# Patient Record
Sex: Female | Born: 1935 | Race: Black or African American | Hispanic: No | Smoking: Current every day smoker
Health system: Southern US, Community
[De-identification: ages and names within clinical notes are randomized; demographics above are authoritative.]

## PROBLEM LIST (undated history)

## (undated) DIAGNOSIS — E559 Vitamin D deficiency, unspecified: Secondary | ICD-10-CM

## (undated) DIAGNOSIS — I10 Essential (primary) hypertension: Secondary | ICD-10-CM

## (undated) DIAGNOSIS — K219 Gastro-esophageal reflux disease without esophagitis: Secondary | ICD-10-CM

## (undated) DIAGNOSIS — M109 Gout, unspecified: Secondary | ICD-10-CM

## (undated) DIAGNOSIS — Z972 Presence of dental prosthetic device (complete) (partial): Secondary | ICD-10-CM

## (undated) DIAGNOSIS — M1990 Unspecified osteoarthritis, unspecified site: Secondary | ICD-10-CM

## (undated) DIAGNOSIS — I509 Heart failure, unspecified: Secondary | ICD-10-CM

## (undated) DIAGNOSIS — R609 Edema, unspecified: Secondary | ICD-10-CM

## (undated) DIAGNOSIS — N189 Chronic kidney disease, unspecified: Secondary | ICD-10-CM

## (undated) DIAGNOSIS — E876 Hypokalemia: Secondary | ICD-10-CM

## (undated) HISTORY — PX: TOTAL HIP ARTHROPLASTY: SHX124

## (undated) HISTORY — PX: ABDOMINAL HYSTERECTOMY: SHX81

---

## 2005-02-27 ENCOUNTER — Emergency Department: Payer: Self-pay | Admitting: Unknown Physician Specialty

## 2005-03-01 ENCOUNTER — Ambulatory Visit: Payer: Self-pay | Admitting: Family Medicine

## 2005-05-09 ENCOUNTER — Emergency Department: Payer: Self-pay | Admitting: Emergency Medicine

## 2005-09-20 ENCOUNTER — Other Ambulatory Visit: Payer: Self-pay

## 2005-09-20 ENCOUNTER — Inpatient Hospital Stay: Payer: Self-pay | Admitting: Orthopedic Surgery

## 2007-01-04 ENCOUNTER — Emergency Department: Payer: Self-pay | Admitting: Emergency Medicine

## 2012-05-13 ENCOUNTER — Emergency Department: Payer: Self-pay | Admitting: Emergency Medicine

## 2012-05-13 LAB — COMPREHENSIVE METABOLIC PANEL
Albumin: 2.4 g/dL — ABNORMAL LOW (ref 3.4–5.0)
Alkaline Phosphatase: 164 U/L — ABNORMAL HIGH (ref 50–136)
Calcium, Total: 9.1 mg/dL (ref 8.5–10.1)
Chloride: 104 mmol/L (ref 98–107)
EGFR (African American): >60
Osmolality: 282 (ref 275–301)
Potassium: 3.4 mmol/L — ABNORMAL LOW (ref 3.5–5.1)
Total Protein: 7.7 g/dL (ref 6.4–8.2)

## 2012-05-13 LAB — URINALYSIS, COMPLETE
Bacteria: NONE SEEN
Bilirubin,UR: NEGATIVE
Leukocyte Esterase: NEGATIVE
Protein: 30
RBC,UR: 2 /HPF (ref 0–5)
Specific Gravity: 1.02 (ref 1.003–1.030)
Squamous Epithelial: <1
WBC UR: 1 /HPF (ref 0–5)

## 2012-05-13 LAB — CBC
MCH: 26.1 pg (ref 26.0–34.0)
Platelet: 333 10*3/uL (ref 150–440)
RBC: 4.49 10*6/uL (ref 3.80–5.20)

## 2012-05-13 LAB — TROPONIN I: Troponin-I: <0.02 ng/mL

## 2015-08-31 ENCOUNTER — Encounter: Payer: Self-pay | Admitting: *Deleted

## 2015-09-07 NOTE — Discharge Instructions (Signed)

## 2015-09-09 ENCOUNTER — Encounter
Admission: RE | Disposition: A | Discharge status: Home / Self Care | Payer: Self-pay | Source: Ambulatory Visit | Attending: Ophthalmology

## 2015-09-09 ENCOUNTER — Ambulatory Visit: Payer: Medicare (Managed Care) | Admitting: Anesthesiology

## 2015-09-09 ENCOUNTER — Ambulatory Visit
Admission: RE | Admit: 2015-09-09 | Discharge: 2015-09-09 | Disposition: A | Discharge status: Home / Self Care | Payer: Medicare (Managed Care) | Source: Ambulatory Visit | Attending: Ophthalmology | Admitting: Ophthalmology

## 2015-09-09 DIAGNOSIS — M1990 Unspecified osteoarthritis, unspecified site: Secondary | ICD-10-CM | POA: Insufficient documentation

## 2015-09-09 DIAGNOSIS — H2589 Other age-related cataract: Secondary | ICD-10-CM | POA: Insufficient documentation

## 2015-09-09 DIAGNOSIS — I110 Hypertensive heart disease with heart failure: Secondary | ICD-10-CM | POA: Insufficient documentation

## 2015-09-09 DIAGNOSIS — I509 Heart failure, unspecified: Secondary | ICD-10-CM | POA: Insufficient documentation

## 2015-09-09 DIAGNOSIS — F17210 Nicotine dependence, cigarettes, uncomplicated: Secondary | ICD-10-CM | POA: Insufficient documentation

## 2015-09-09 HISTORY — DX: Hypokalemia: E87.6

## 2015-09-09 HISTORY — DX: Presence of dental prosthetic device (complete) (partial): Z97.2

## 2015-09-09 HISTORY — DX: Unspecified osteoarthritis, unspecified site: M19.90

## 2015-09-09 HISTORY — DX: Chronic kidney disease, unspecified: N18.9

## 2015-09-09 HISTORY — PX: CATARACT EXTRACTION W/PHACO: SHX586

## 2015-09-09 HISTORY — DX: Gout, unspecified: M10.9

## 2015-09-09 HISTORY — DX: Essential (primary) hypertension: I10

## 2015-09-09 HISTORY — DX: Heart failure, unspecified: I50.9

## 2015-09-09 HISTORY — DX: Vitamin D deficiency, unspecified: E55.9

## 2015-09-09 SURGERY — PHACOEMULSIFICATION, CATARACT, WITH IOL INSERTION
Anesthesia: Monitor Anesthesia Care | Site: Eye | Laterality: Right | Wound class: Clean

## 2015-09-09 MED ORDER — NA HYALUR & NA CHOND-NA HYALUR 0.4-0.35 ML IO KIT
PACK | INTRAOCULAR | Status: DC | PRN
  Administered 2015-09-09: 1 mL via INTRAOCULAR

## 2015-09-09 MED ORDER — MIDAZOLAM HCL 2 MG/2ML IJ SOLN
INTRAMUSCULAR | Status: DC | PRN
  Administered 2015-09-09: 2 mg via INTRAVENOUS

## 2015-09-09 MED ORDER — BSS IO SOLN
INTRAOCULAR | Status: DC | PRN
  Administered 2015-09-09: 12:00:00 via OPHTHALMIC
  Administered 2015-09-09: 68 mL via OPHTHALMIC

## 2015-09-09 MED ORDER — TRYPAN BLUE 0.06 % OP SOLN
OPHTHALMIC | Status: DC | PRN
  Administered 2015-09-09: 0.5 mL via INTRAOCULAR

## 2015-09-09 MED ORDER — FENTANYL CITRATE (PF) 100 MCG/2ML IJ SOLN
INTRAMUSCULAR | Status: DC | PRN
  Administered 2015-09-09: 50 ug via INTRAVENOUS

## 2015-09-09 MED ORDER — TETRACAINE HCL 0.5 % OP SOLN
1.0000 [drp] | OPHTHALMIC | Status: DC | PRN
  Administered 2015-09-09: 1 [drp] via OPHTHALMIC

## 2015-09-09 MED ORDER — POVIDONE-IODINE 5 % OP SOLN
1.0000 "application " | OPHTHALMIC | Status: DC | PRN
  Administered 2015-09-09: 1 via OPHTHALMIC

## 2015-09-09 MED ORDER — SODIUM HYALURONATE 23 MG/ML IO SOLN
INTRAOCULAR | Status: DC | PRN
  Administered 2015-09-09: .3 mL via INTRAOCULAR

## 2015-09-09 MED ORDER — BSS IO SOLN
INTRAOCULAR | Status: DC | PRN
  Administered 2015-09-09: 30 mL via INTRAOCULAR

## 2015-09-09 MED ORDER — LIDOCAINE HCL (PF) 4 % IJ SOLN
INTRAOCULAR | Status: DC | PRN
  Administered 2015-09-09: 1 mL via OPHTHALMIC

## 2015-09-09 MED ORDER — ARMC OPHTHALMIC DILATING GEL
1.0000 "application " | OPHTHALMIC | Status: DC | PRN
  Administered 2015-09-09 (×2): 1 via OPHTHALMIC

## 2015-09-09 MED ORDER — TIMOLOL MALEATE 0.5 % OP SOLN
OPHTHALMIC | Status: DC | PRN
  Administered 2015-09-09: 1 [drp] via OPHTHALMIC

## 2015-09-09 MED ORDER — CEFUROXIME OPHTHALMIC INJECTION 1 MG/0.1 ML
INJECTION | OPHTHALMIC | Status: DC | PRN
  Administered 2015-09-09: 0.1 mL via INTRACAMERAL

## 2015-09-09 SURGICAL SUPPLY — 25 items
CANNULA ANT/CHMB 27GA (MISCELLANEOUS) ×3 IMPLANT
CARTRIDGE ABBOTT (MISCELLANEOUS) ×3 IMPLANT
GLOVE SURG LX 7.5 STRW (GLOVE) ×2
GLOVE SURG LX STRL 7.5 STRW (GLOVE) ×1 IMPLANT
GLOVE SURG TRIUMPH 8.0 PF LTX (GLOVE) ×3 IMPLANT
GOWN STRL REUS W/ TWL LRG LVL3 (GOWN DISPOSABLE) ×2 IMPLANT
GOWN STRL REUS W/TWL LRG LVL3 (GOWN DISPOSABLE) ×4
LENS IOL TECNIS ITEC 20.0 (Intraocular Lens) ×3 IMPLANT
MARKER SKIN DUAL TIP RULER LAB (MISCELLANEOUS) ×3 IMPLANT
NDL RETROBULBAR .5 NSTRL (NEEDLE) IMPLANT
NEEDLE FILTER BLUNT 18X 1/2SAF (NEEDLE) ×2
NEEDLE FILTER BLUNT 18X1 1/2 (NEEDLE) ×1 IMPLANT
PACK CATARACT BRASINGTON (MISCELLANEOUS) ×3 IMPLANT
PACK EYE AFTER SURG (MISCELLANEOUS) ×3 IMPLANT
PACK OPTHALMIC (MISCELLANEOUS) ×3 IMPLANT
RING MALYGIN 7.0 (MISCELLANEOUS) IMPLANT
SUT ETHILON 10-0 CS-B-6CS-B-6 (SUTURE)
SUT VICRYL  9 0 (SUTURE)
SUT VICRYL 9 0 (SUTURE) IMPLANT
SUTURE EHLN 10-0 CS-B-6CS-B-6 (SUTURE) IMPLANT
SYR 3ML LL SCALE MARK (SYRINGE) ×3 IMPLANT
SYR 5ML LL (SYRINGE) ×3 IMPLANT
SYR TB 1ML LUER SLIP (SYRINGE) ×3 IMPLANT
WATER STERILE IRR 250ML POUR (IV SOLUTION) ×3 IMPLANT
WIPE NON LINTING 3.25X3.25 (MISCELLANEOUS) ×3 IMPLANT

## 2015-09-09 NOTE — Op Note (Signed)
OPERATIVE NOTE  Tammy MartesCatherine C Meiklejohn [id number] 09/09/2015   PREOPERATIVE DIAGNOSIS:  H25.89 CATARACT           Mature (Total) Cataract Right Eye H25.89   POSTOPERATIVE DIAGNOSIS: mature cataract right eye          PROCEDURE:  Phacoemusification with posterior chamber intraocular lens placement of the right eye .  Vision Blue dye was used to stain the lens capsule.  LENS:   Implant Name Type Inv. Item Serial No. Manufacturer Lot No. LRB No. Used  LENS IOL DIOP 20.0 - [id number] Intraocular Lens LENS IOL DIOP 20.0 [id number] AMO   Right 1       ULTRASOUND TIME: 20 of 1 minutes 41 seconds, CDE 20.5  SURGEON:  Deirdre Evenerhadwick R. Carole Deere, MD   ANESTHESIA:  Topical with tetracaine drops and 2% Xylocaine jelly, augmented with 1% preservative-free intracameral lidocaine.   COMPLICATIONS:  None.   DESCRIPTION OF PROCEDURE:  The patient was identified in the holding room and transported to the operating room and placed in the supine position under the operating microscope. Theright eye was identified as the operative eye and it was prepped and draped in the usual sterile ophthalmic fashion.  A 1 millimeter clear-corneal paracentesis was made at the 12:00 position.  0.5 ml of preservative-free 1% lidocaine was injected into the anterior chamber. The anterior chamber was filled with Healon 5 viscoelastic.  A 2.4 millimeter keratome was used to make a near-clear corneal incision at the 9:00 position.  The anterior chamber was filled with Healon 5 viscoelastic.  Vision Blue dye was then injected under the viscoelastic to stain the lens capsule.  BSS was then used to wash the dye out.  Additional Healon 5 was placed into the anterior chamber.  A curvilinear capsulorrhexis was made with a cystotome and capsulorrhexis forceps.  Balanced salt solution was used to hydrodissect and hydrodelineate the nucleus.  Viscoat was then placed in the anterior chamber.   Phacoemulsification was then used in stop and  chop fashion to remove the lens nucleus and epinucleus.  The remaining cortex was then removed using the irrigation and aspiration handpiece. Provisc was then placed into the capsular bag to distend it for lens placement.  A 20.0 -diopter lens was then injected into the capsular bag.  The remaining viscoelastic was aspirated.   Wounds were hydrated with balanced salt solution.  The anterior chamber was inflated to a physiologic pressure with balanced salt solution. Cefuroxime 0.1 ml of a 10mg /ml solution was injected into the anterior chamber for a dose of 1 mg of intracameral antibiotic at the completion of the case. No wound leaks were noted.  Topical Timolol and Brimonidine drops were applied to the eye.  The patient was taken to the recovery room in stable condition without complications of anesthesia or surgery.  Georga Stys 09/09/2015, 11:45 AM

## 2015-09-09 NOTE — Transfer of Care (Signed)
Immediate Anesthesia Transfer of Care Note  Patient: Tammy MartesCatherine C Valbuena  Procedure(s) Performed: Procedure(s) with comments: CATARACT EXTRACTION PHACO AND INTRAOCULAR LENS PLACEMENT (IOC) right eye  (Right) - HEALON 5 VISION BLUE  Patient Location: PACU  Anesthesia Type: MAC  Level of Consciousness: awake, alert  and patient cooperative  Airway and Oxygen Therapy: Patient Spontanous Breathing and Patient connected to supplemental oxygen  Post-op Assessment: Post-op Vital signs reviewed, Patient's Cardiovascular Status Stable, Respiratory Function Stable, Patent Airway and No signs of Nausea or vomiting  Post-op Vital Signs: Reviewed and stable  Complications: No apparent anesthesia complications

## 2015-09-09 NOTE — Anesthesia Preprocedure Evaluation (Addendum)
Anesthesia Evaluation  Patient identified by MRN, date of birth, ID band Patient awake    Reviewed: Allergy & Precautions, NPO status , Patient's Chart, lab work & pertinent test results  Airway Mallampati: II  TM Distance: >3 FB Neck ROM: Full    Dental  (+) Upper Dentures, Lower Dentures   Pulmonary Current Smoker,    Pulmonary exam normal        Cardiovascular hypertension, Pt. on medications +CHF  Normal cardiovascular exam     Neuro/Psych negative neurological ROS  negative psych ROS   GI/Hepatic negative GI ROS, Neg liver ROS,   Endo/Other    Renal/GU CRFRenal disease     Musculoskeletal  (+) Arthritis , Osteoarthritis,    Abdominal   Peds  Hematology negative hematology ROS (+)   Anesthesia Other Findings   Reproductive/Obstetrics                            Anesthesia Physical Anesthesia Plan  ASA: III  Anesthesia Plan: MAC   Post-op Pain Management:    Induction: Intravenous  Airway Management Planned:   Additional Equipment:   Intra-op Plan:   Post-operative Plan:   Informed Consent: I have reviewed the patients History and Physical, chart, labs and discussed the procedure including the risks, benefits and alternatives for the proposed anesthesia with the patient or authorized representative who has indicated his/her understanding and acceptance.     Plan Discussed with: CRNA  Anesthesia Plan Comments:         Anesthesia Quick Evaluation

## 2015-09-09 NOTE — H&P (Signed)
  The History and Physical notes are on paper, have been signed, and are to be scanned. The patient remains stable and unchanged from the H&P.   Previous H&P reviewed, patient examined, and there are no changes.  Tammy Mann 09/09/2015 10:26 AM

## 2015-09-09 NOTE — Anesthesia Postprocedure Evaluation (Signed)
Anesthesia Post Note  Patient: [name]  Procedure(s) Performed: Procedure(s) (LRB): CATARACT EXTRACTION PHACO AND INTRAOCULAR LENS PLACEMENT (IOC) right eye  (Right)  Patient location during evaluation: PACU Anesthesia Type: MAC Level of consciousness: awake and alert and oriented Pain management: pain level controlled Vital Signs Assessment: post-procedure vital signs reviewed and stable Respiratory status: spontaneous breathing and nonlabored ventilation Cardiovascular status: stable Postop Assessment: no signs of nausea or vomiting and adequate PO intake Anesthetic complications: no    Harolyn RutherfordJoshua Danikah Budzik

## 2015-09-10 ENCOUNTER — Encounter: Payer: Self-pay | Admitting: Ophthalmology

## 2017-05-18 ENCOUNTER — Other Ambulatory Visit: Payer: Self-pay | Admitting: Internal Medicine

## 2017-05-18 DIAGNOSIS — R1012 Left upper quadrant pain: Secondary | ICD-10-CM

## 2017-05-19 ENCOUNTER — Ambulatory Visit: Payer: Medicare (Managed Care)

## 2017-05-22 ENCOUNTER — Ambulatory Visit: Payer: Medicare (Managed Care)

## 2017-05-25 ENCOUNTER — Ambulatory Visit
Admission: RE | Admit: 2017-05-25 | Discharge: 2017-05-25 | Disposition: A | Discharge status: Home / Self Care | Payer: Medicare (Managed Care) | Source: Ambulatory Visit | Attending: Internal Medicine | Admitting: Internal Medicine

## 2017-05-25 DIAGNOSIS — R1012 Left upper quadrant pain: Secondary | ICD-10-CM | POA: Insufficient documentation

## 2017-05-25 DIAGNOSIS — N281 Cyst of kidney, acquired: Secondary | ICD-10-CM | POA: Insufficient documentation

## 2017-08-07 ENCOUNTER — Encounter: Payer: Self-pay | Admitting: *Deleted

## 2017-08-10 ENCOUNTER — Ambulatory Visit
Admission: RE | Admit: 2017-08-10 | Discharge: 2017-08-10 | Disposition: A | Discharge status: Home / Self Care | Payer: Medicare (Managed Care) | Source: Ambulatory Visit | Attending: Ophthalmology | Admitting: Ophthalmology

## 2017-08-10 ENCOUNTER — Ambulatory Visit: Payer: Medicare (Managed Care) | Admitting: Anesthesiology

## 2017-08-10 ENCOUNTER — Encounter
Admission: RE | Disposition: A | Discharge status: Home / Self Care | Payer: Self-pay | Source: Ambulatory Visit | Attending: Ophthalmology

## 2017-08-10 ENCOUNTER — Other Ambulatory Visit: Payer: Self-pay

## 2017-08-10 DIAGNOSIS — H2512 Age-related nuclear cataract, left eye: Secondary | ICD-10-CM | POA: Insufficient documentation

## 2017-08-10 DIAGNOSIS — I5032 Chronic diastolic (congestive) heart failure: Secondary | ICD-10-CM | POA: Insufficient documentation

## 2017-08-10 DIAGNOSIS — Z79899 Other long term (current) drug therapy: Secondary | ICD-10-CM | POA: Diagnosis not present

## 2017-08-10 DIAGNOSIS — Z7982 Long term (current) use of aspirin: Secondary | ICD-10-CM | POA: Diagnosis not present

## 2017-08-10 DIAGNOSIS — Z887 Allergy status to serum and vaccine status: Secondary | ICD-10-CM | POA: Diagnosis not present

## 2017-08-10 DIAGNOSIS — Z96649 Presence of unspecified artificial hip joint: Secondary | ICD-10-CM | POA: Diagnosis not present

## 2017-08-10 DIAGNOSIS — N183 Chronic kidney disease, stage 3 (moderate): Secondary | ICD-10-CM | POA: Diagnosis not present

## 2017-08-10 DIAGNOSIS — I130 Hypertensive heart and chronic kidney disease with heart failure and stage 1 through stage 4 chronic kidney disease, or unspecified chronic kidney disease: Secondary | ICD-10-CM | POA: Diagnosis not present

## 2017-08-10 DIAGNOSIS — E559 Vitamin D deficiency, unspecified: Secondary | ICD-10-CM | POA: Diagnosis not present

## 2017-08-10 DIAGNOSIS — K219 Gastro-esophageal reflux disease without esophagitis: Secondary | ICD-10-CM | POA: Diagnosis not present

## 2017-08-10 DIAGNOSIS — M1990 Unspecified osteoarthritis, unspecified site: Secondary | ICD-10-CM | POA: Insufficient documentation

## 2017-08-10 DIAGNOSIS — F17210 Nicotine dependence, cigarettes, uncomplicated: Secondary | ICD-10-CM | POA: Insufficient documentation

## 2017-08-10 HISTORY — PX: CATARACT EXTRACTION W/PHACO: SHX586

## 2017-08-10 HISTORY — DX: Gastro-esophageal reflux disease without esophagitis: K21.9

## 2017-08-10 HISTORY — DX: Edema, unspecified: R60.9

## 2017-08-10 SURGERY — PHACOEMULSIFICATION, CATARACT, WITH IOL INSERTION
Anesthesia: Monitor Anesthesia Care | Site: Eye | Laterality: Left | Wound class: Clean

## 2017-08-10 MED ORDER — EPINEPHRINE PF 1 MG/ML IJ SOLN
INTRAOCULAR | Status: DC | PRN
  Administered 2017-08-10: 11:00:00 via OPHTHALMIC

## 2017-08-10 MED ORDER — MIDAZOLAM HCL 2 MG/2ML IJ SOLN
INTRAMUSCULAR | Status: AC
  Filled 2017-08-10: qty 2

## 2017-08-10 MED ORDER — MOXIFLOXACIN HCL 0.5 % OP SOLN: 1.0000 [drp] | OPHTHALMIC | Status: DC | PRN

## 2017-08-10 MED ORDER — SODIUM HYALURONATE 23 MG/ML IO SOLN
INTRAOCULAR | Status: DC | PRN
  Administered 2017-08-10: 0.6 mL via INTRAOCULAR

## 2017-08-10 MED ORDER — POVIDONE-IODINE 5 % OP SOLN
OPHTHALMIC | Status: DC | PRN
  Administered 2017-08-10: 1 via OPHTHALMIC

## 2017-08-10 MED ORDER — ARMC OPHTHALMIC DILATING DROPS
OPHTHALMIC | Status: AC
  Filled 2017-08-10: qty 0.4

## 2017-08-10 MED ORDER — LIDOCAINE HCL (PF) 4 % IJ SOLN
INTRAMUSCULAR | Status: AC
  Filled 2017-08-10: qty 5

## 2017-08-10 MED ORDER — SODIUM CHLORIDE 0.9 % IV SOLN
INTRAVENOUS | Status: DC
  Administered 2017-08-10: 09:00:00 via INTRAVENOUS

## 2017-08-10 MED ORDER — SODIUM HYALURONATE 23 MG/ML IO SOLN
INTRAOCULAR | Status: AC
  Filled 2017-08-10: qty 0.6

## 2017-08-10 MED ORDER — POVIDONE-IODINE 5 % OP SOLN
OPHTHALMIC | Status: AC
  Filled 2017-08-10: qty 30

## 2017-08-10 MED ORDER — MOXIFLOXACIN HCL 0.5 % OP SOLN
OPHTHALMIC | Status: AC
  Filled 2017-08-10: qty 3

## 2017-08-10 MED ORDER — MIDAZOLAM HCL 2 MG/2ML IJ SOLN
INTRAMUSCULAR | Status: DC | PRN
  Administered 2017-08-10: 1 mg via INTRAVENOUS
  Administered 2017-08-10: .5 mg via INTRAVENOUS

## 2017-08-10 MED ORDER — BSS IO SOLN
INTRAOCULAR | Status: DC | PRN
  Administered 2017-08-10: 4 mL via OPHTHALMIC

## 2017-08-10 MED ORDER — ARMC OPHTHALMIC DILATING DROPS
1.0000 | OPHTHALMIC | Status: AC
Start: 2017-08-10 — End: 2017-08-10
  Administered 2017-08-10 (×3): 1 via OPHTHALMIC

## 2017-08-10 MED ORDER — EPINEPHRINE PF 1 MG/ML IJ SOLN
INTRAMUSCULAR | Status: AC
  Filled 2017-08-10: qty 2

## 2017-08-10 MED ORDER — SODIUM HYALURONATE 10 MG/ML IO SOLN
INTRAOCULAR | Status: DC | PRN
  Administered 2017-08-10: 0.55 mL via INTRAOCULAR

## 2017-08-10 SURGICAL SUPPLY — 16 items
DISSECTOR HYDRO NUCLEUS 50X22 (MISCELLANEOUS) ×3 IMPLANT
GLOVE BIO SURGEON STRL SZ8 (GLOVE) ×3 IMPLANT
GLOVE BIOGEL M 6.5 STRL (GLOVE) ×3 IMPLANT
GLOVE SURG LX 7.5 STRW (GLOVE) ×2
GLOVE SURG LX STRL 7.5 STRW (GLOVE) ×1 IMPLANT
GOWN STRL REUS W/ TWL LRG LVL3 (GOWN DISPOSABLE) ×2 IMPLANT
GOWN STRL REUS W/TWL LRG LVL3 (GOWN DISPOSABLE) ×4
LABEL CATARACT MEDS ST (LABEL) ×3 IMPLANT
LENS IOL TECNIS ITEC 20.5 (Intraocular Lens) ×3 IMPLANT
PACK CATARACT (MISCELLANEOUS) ×3 IMPLANT
PACK CATARACT KING (MISCELLANEOUS) ×3 IMPLANT
PACK EYE AFTER SURG (MISCELLANEOUS) ×3 IMPLANT
SOL BSS BAG (MISCELLANEOUS) ×3
SOLUTION BSS BAG (MISCELLANEOUS) ×1 IMPLANT
WATER STERILE IRR 250ML POUR (IV SOLUTION) ×3 IMPLANT
WIPE NON LINTING 3.25X3.25 (MISCELLANEOUS) ×3 IMPLANT

## 2017-08-10 NOTE — H&P (Signed)
The History and Physical notes are on paper, have been signed, and are to be scanned.   I have examined the patient and there are no changes to the H&P.   Madelyn Tlatelpa 08/10/2017 10:47 AM   

## 2017-08-10 NOTE — H&P (Signed)
The History and Physical notes are on paper, have been signed, and are to be scanned.   I have examined the patient and there are no changes to the H&P.   Willey Blade 08/10/2017 10:07 AM

## 2017-08-10 NOTE — Discharge Instructions (Addendum)
Eye Surgery Discharge Instructions  Expect mild scratchy sensation or mild soreness. DO NOT RUB YOUR EYE!  The day of surgery:  Minimal physical activity, but bed rest is not required  No reading, computer work, or close hand work  No bending, lifting, or straining.  May watch TV  For 24 hours:  No driving, legal decisions, or alcoholic beverages  Safety precautions  Eat anything you prefer: It is better to start with liquids, then soup then solid foods.  _____ Eye patch should be worn while sleeping.  ____ Solar shield eyeglasses should be worn for comfort in the sunlight.  Resume all regular medications including aspirin or Coumadin if these were discontinued prior to surgery. You may shower, bathe, shave, or wash your hair. Tylenol may be taken for mild discomfort.  Call your doctor if you experience significant pain, nausea, or vomiting, fever > 101 or other signs of infection. [phone] or [phone] Specific instructions:  Follow-up Information    [place] Crane, MD Follow up.   Specialty:  Ophthalmology Why:  May 24 at 10:30am in [facility] information: [address] [place] [address] [phone]

## 2017-08-10 NOTE — Anesthesia Post-op Follow-up Note (Signed)
Anesthesia QCDR form completed.        

## 2017-08-10 NOTE — Transfer of Care (Signed)
Immediate Anesthesia Transfer of Care Note  Patient: Tammy Mann  Procedure(s) Performed: CATARACT EXTRACTION PHACO AND INTRAOCULAR LENS PLACEMENT (IOC) (Left Eye)  Patient Location: PACU and Short Stay  Anesthesia Type:MAC  Level of Consciousness: awake  Airway & Oxygen Therapy: Patient Spontanous Breathing  Post-op Assessment: Report given to RN  Post vital signs: stable  Last Vitals:  Vitals Value Taken Time  BP    Temp    Pulse    Resp    SpO2      Last Pain:  Vitals:   08/10/17 0820  TempSrc: Temporal  PainSc: 0-No pain         Complications: No apparent anesthesia complications

## 2017-08-10 NOTE — Anesthesia Preprocedure Evaluation (Addendum)
Anesthesia Evaluation  Patient identified by MRN, date of birth, ID band Patient awake    Reviewed: Allergy & Precautions, NPO status , Patient's Chart, lab work & pertinent test results, reviewed documented beta blocker date and time   Airway Mallampati: II  TM Distance: >3 FB Neck ROM: Full    Dental  (+) Upper Dentures, Lower Dentures   Pulmonary Current Smoker,    Pulmonary exam normal        Cardiovascular hypertension, Pt. on medications and Pt. on home beta blockers +CHF  Normal cardiovascular exam     Neuro/Psych negative neurological ROS  negative psych ROS   GI/Hepatic negative GI ROS, Neg liver ROS, GERD  ,  Endo/Other    Renal/GU Renal InsufficiencyRenal disease     Musculoskeletal  (+) Arthritis , Osteoarthritis,    Abdominal   Peds  Hematology negative hematology ROS (+)   Anesthesia Other Findings   Reproductive/Obstetrics                             Anesthesia Physical  Anesthesia Plan  ASA: III  Anesthesia Plan: MAC   Post-op Pain Management:    Induction: Intravenous  PONV Risk Score and Plan:   Airway Management Planned: Nasal Cannula  Additional Equipment:   Intra-op Plan:   Post-operative Plan:   Informed Consent: I have reviewed the patients History and Physical, chart, labs and discussed the procedure including the risks, benefits and alternatives for the proposed anesthesia with the patient or authorized representative who has indicated his/her understanding and acceptance.     Plan Discussed with: CRNA  Anesthesia Plan Comments:         Anesthesia Quick Evaluation

## 2017-08-10 NOTE — Op Note (Signed)
OPERATIVE NOTE  REAH JUSTO [id number] 08/10/2017   PREOPERATIVE DIAGNOSIS:  Nuclear sclerotic cataract left eye.  H25.12   POSTOPERATIVE DIAGNOSIS:    Nuclear sclerotic cataract left eye.     PROCEDURE:  Phacoemusification with posterior chamber intraocular lens placement of the left eye   LENS:   Implant Name Type Inv. Item Serial No. Manufacturer Lot No. LRB No. Used  LENS IOL DIOP 20.5 - [id number] Intraocular Lens LENS IOL DIOP 20.5 [id number] AMO  Left 1       PCB00 +20.5   ULTRASOUND TIME: 0 minutes 28.6 seconds.  CDE 3.82   SURGEON:  Willey Blade, MD, MPH   ANESTHESIA:  Topical with tetracaine drops augmented with 1% preservative-free intracameral lidocaine.  ESTIMATED BLOOD LOSS: <1 mL   COMPLICATIONS:  None.   DESCRIPTION OF PROCEDURE:  The patient was identified in the holding room and transported to the operating room and placed in the supine position under the operating microscope.  The left eye was identified as the operative eye and it was prepped and draped in the usual sterile ophthalmic fashion.   A 1.0 millimeter clear-corneal paracentesis was made at the 5:00 position. 0.5 ml of preservative-free 1% lidocaine with epinephrine was injected into the anterior chamber.  The anterior chamber was filled with Healon 5 viscoelastic.  A 2.4 millimeter keratome was used to make a near-clear corneal incision at the 2:00 position.  A curvilinear capsulorrhexis was made with a cystotome and capsulorrhexis forceps.  Balanced salt solution was used to hydrodissect and hydrodelineate the nucleus.   Phacoemulsification was then used in stop and chop fashion to remove the lens nucleus and epinucleus.  The remaining cortex was then removed using the irrigation and aspiration handpiece. Healon was then placed into the capsular bag to distend it for lens placement.  A lens was then injected into the capsular bag.  The remaining viscoelastic was aspirated.   Wounds were  hydrated with balanced salt solution.  The anterior chamber was inflated to a physiologic pressure with balanced salt solution.  Intracameral vigamox 0.1 mL undiltued was injected into the eye and a drop placed onto the ocular surface.  No wound leaks were noted.  The patient was taken to the recovery room in stable condition without complications of anesthesia or surgery  Willey Blade 08/10/2017, 11:17 AM

## 2017-08-11 NOTE — Anesthesia Postprocedure Evaluation (Signed)
Anesthesia Post Note  Patient: Tammy Mann  Procedure(s) Performed: CATARACT EXTRACTION PHACO AND INTRAOCULAR LENS PLACEMENT ([place]) (Left Eye)  Patient location during evaluation: PACU Anesthesia Type: MAC Level of consciousness: awake and alert and oriented Pain management: pain level controlled Vital Signs Assessment: post-procedure vital signs reviewed and stable Respiratory status: spontaneous breathing Cardiovascular status: blood pressure returned to baseline Anesthetic complications: no     Last Vitals:  Vitals:   08/10/17 1121 08/10/17 1142  BP: (!) 166/81 (!) 157/75  Pulse: 72 67  Resp:    Temp: 36.6 C   SpO2: 99%     Last Pain:  Vitals:   08/10/17 1121  TempSrc:   PainSc: 0-No pain                 Jannett Schmall

## 2019-06-05 IMAGING — US US ABDOMEN COMPLETE
1 series · 13 of 25 positions shown · non-contrast
Comparison: None.

CLINICAL DATA: Left upper quadrant pain

EXAM:
ABDOMEN ULTRASOUND COMPLETE

[Series 1: us abdomen complete · 0.19mm/px · 13 of 131 slices shown]
[im 1/131]
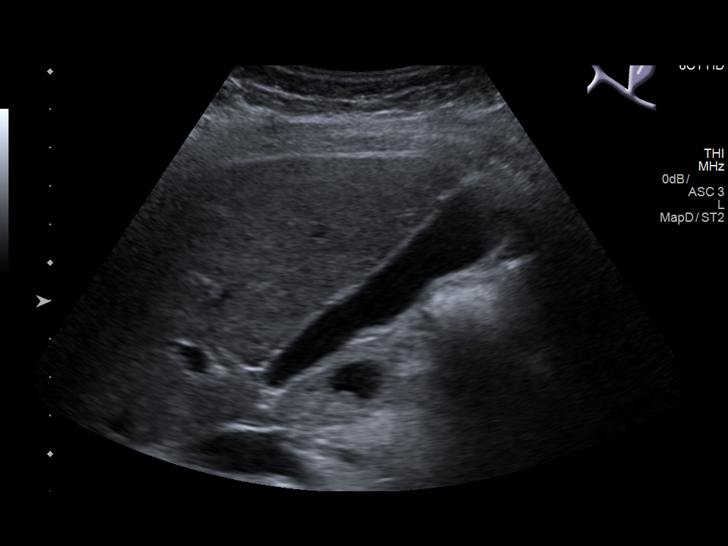
[im 11/131]
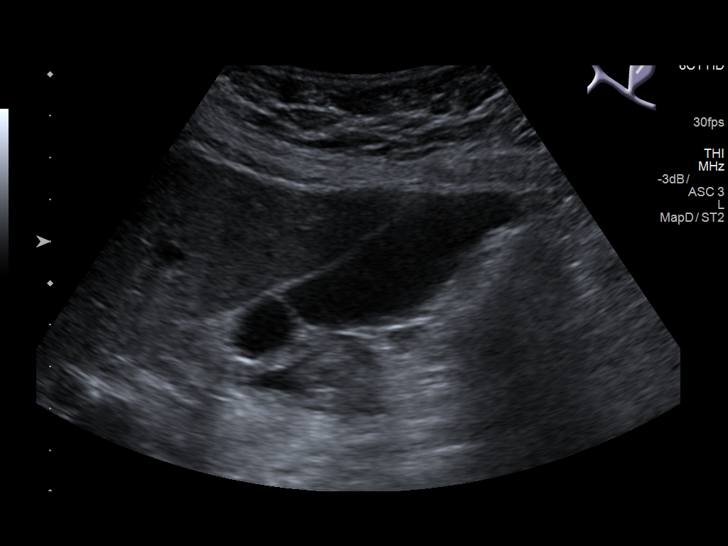
[im 22/131]
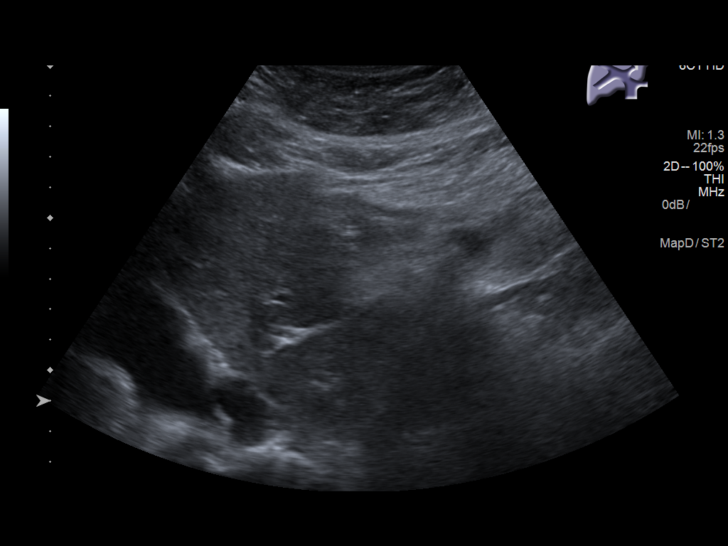
[im 33/131]
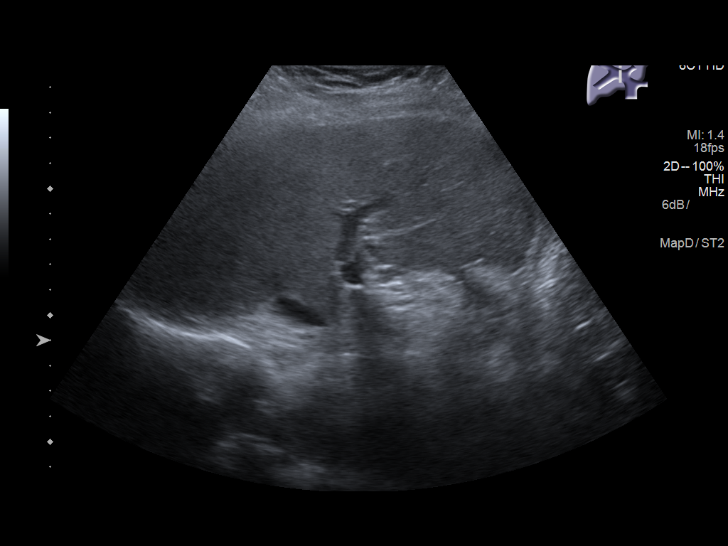
[im 44/131]
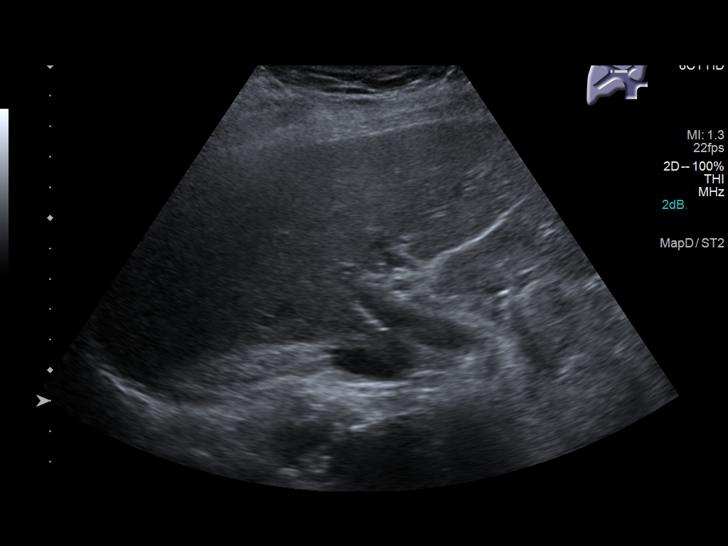
[im 55/131]
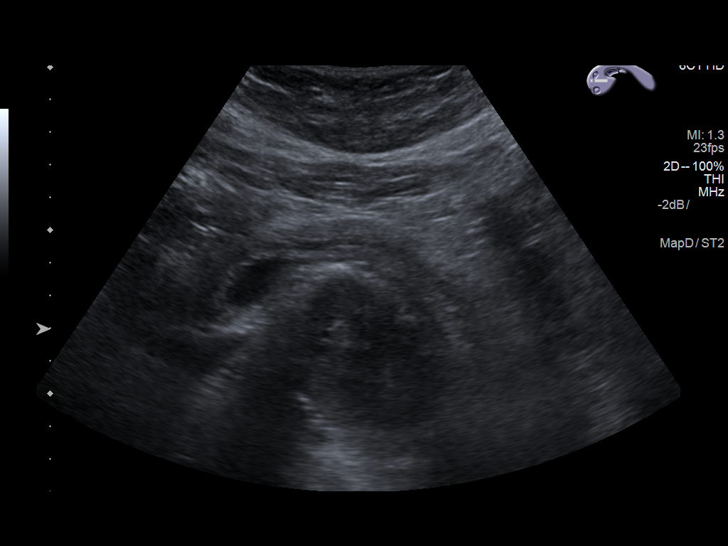
[im 66/131]
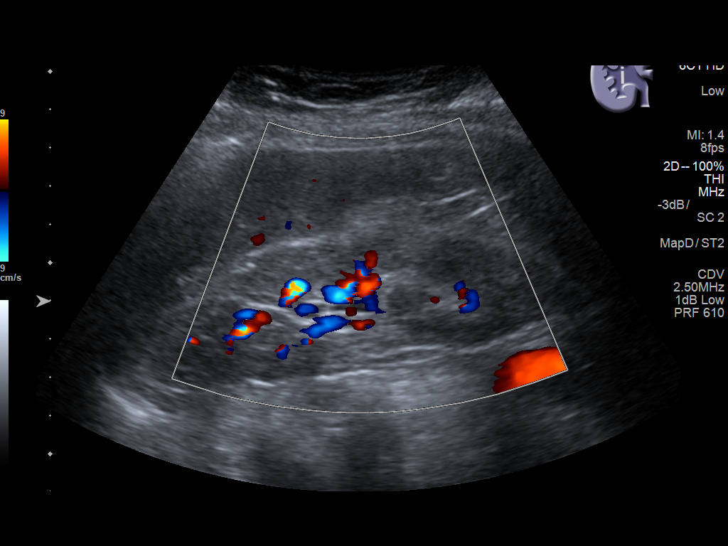
[im 76/131]
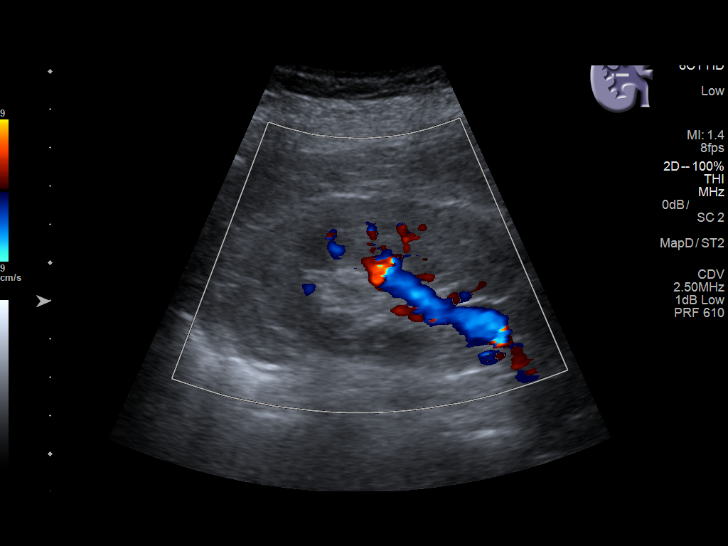
[im 87/131]
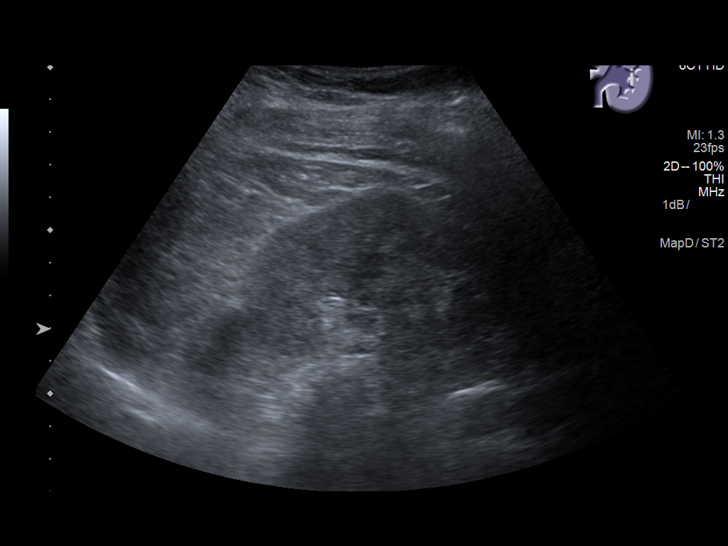
[im 98/131]
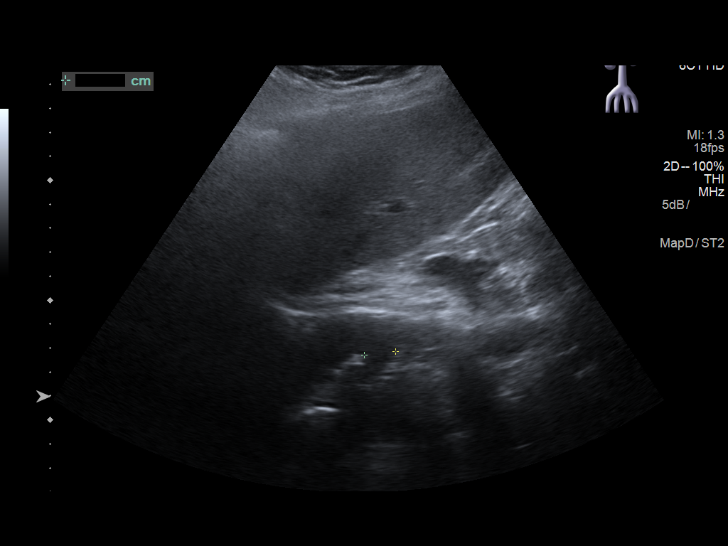
[im 109/131]
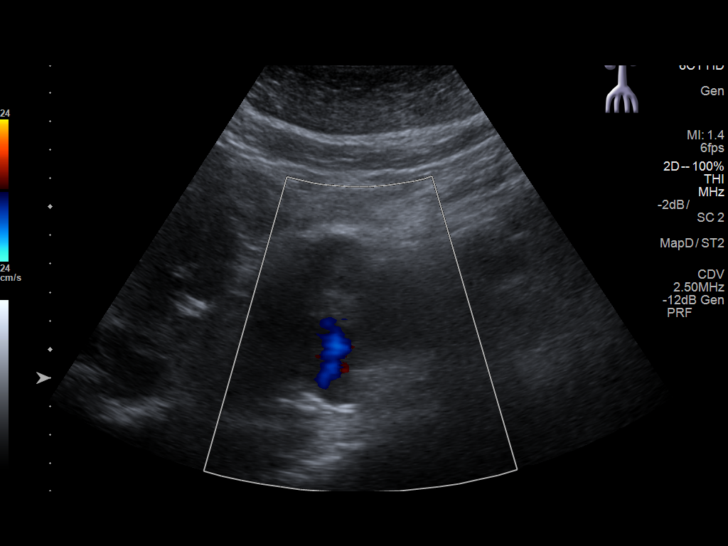
[im 120/131]
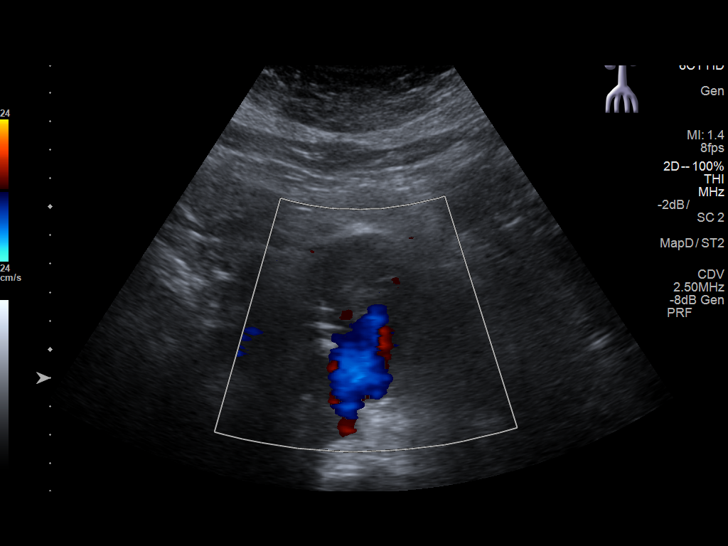
[im 131/131]
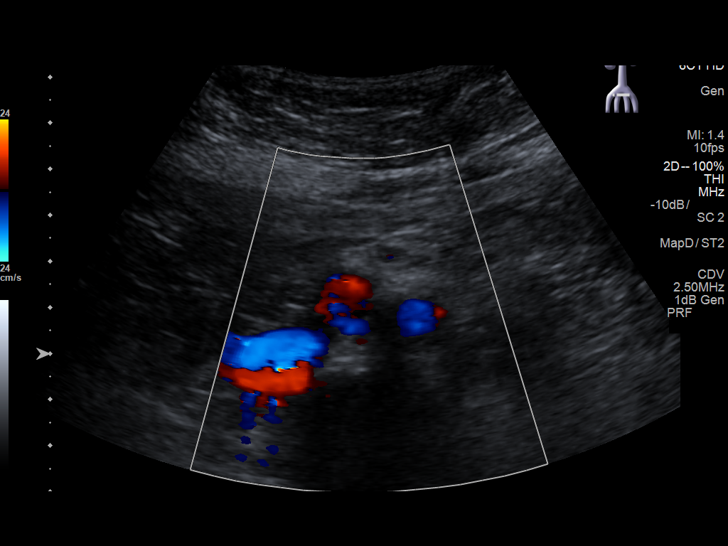

[13 of 25 positions shown; findings below may reference images not displayed]

FINDINGS: Gallbladder: No gallstones or wall thickening visualized. No
sonographic Murphy sign noted by sonographer.

Common bile duct: Diameter: 4 mm

Liver: No focal lesion identified. Within normal limits in
parenchymal echogenicity. Portal vein is patent on color Doppler
imaging with normal direction of blood flow towards the liver.

IVC: No abnormality visualized.

Pancreas: Visualized portion unremarkable.

Spleen: Size and appearance within normal limits.

Right Kidney: Length: 11.1 cm.. 2 cm cyst is noted in the parapelvic
region inferiorly.

Left Kidney: Length: 10.4 cm.. Echogenicity within normal limits. No
mass or hydronephrosis visualized.

Abdominal aorta: There are changes suggestive of fusiform dilatation
of the proximal aorta. This is incompletely evaluated on this exam.
The more distal aorta is within normal limits.

Other findings: None.
IMPRESSION: Changes suggestive of fusiform dilatation of the proximal aorta.
This is incompletely evaluated on this exam and exact measurements
cannot be determined.. Further evaluation by means of CTA is
recommended.

Right renal cyst.

These results will be called to the ordering clinician or
representative by the Radiologist Assistant, and communication
documented in the PACS or zVision Dashboard.

## 2021-08-08 ENCOUNTER — Other Ambulatory Visit: Payer: Self-pay

## 2021-08-08 ENCOUNTER — Emergency Department
Admission: EM | Admit: 2021-08-08 | Discharge: 2021-08-08 | Disposition: A | Discharge status: Home / Self Care | Payer: Medicare (Managed Care) | Attending: Emergency Medicine | Admitting: Emergency Medicine

## 2021-08-08 DIAGNOSIS — E860 Dehydration: Secondary | ICD-10-CM | POA: Diagnosis not present

## 2021-08-08 DIAGNOSIS — D72829 Elevated white blood cell count, unspecified: Secondary | ICD-10-CM | POA: Insufficient documentation

## 2021-08-08 DIAGNOSIS — I10 Essential (primary) hypertension: Secondary | ICD-10-CM | POA: Diagnosis not present

## 2021-08-08 DIAGNOSIS — R42 Dizziness and giddiness: Secondary | ICD-10-CM | POA: Diagnosis present

## 2021-08-08 DIAGNOSIS — R000 Tachycardia, unspecified: Secondary | ICD-10-CM | POA: Diagnosis not present

## 2021-08-08 DIAGNOSIS — R944 Abnormal results of kidney function studies: Secondary | ICD-10-CM | POA: Insufficient documentation

## 2021-08-08 LAB — CBC WITH DIFFERENTIAL/PLATELET
Abs Immature Granulocytes: 0.03 10*3/uL (ref 0.00–0.07)
Basophils Absolute: 0.1 10*3/uL (ref 0.0–0.1)
Basophils Relative: 1 %
Eosinophils Absolute: 0.2 10*3/uL (ref 0.0–0.5)
Eosinophils Relative: 2 %
HCT: 33.1 % — ABNORMAL LOW (ref 36.0–46.0)
Hemoglobin: 10.1 g/dL — ABNORMAL LOW (ref 12.0–15.0)
Immature Granulocytes: 0 %
Lymphocytes Relative: 24 %
Lymphs Abs: 1.7 10*3/uL (ref 0.7–4.0)
MCH: 26.1 pg (ref 26.0–34.0)
MCHC: 30.5 g/dL (ref 30.0–36.0)
MCV: 85.5 fL (ref 80.0–100.0)
Monocytes Absolute: 0.7 10*3/uL (ref 0.1–1.0)
Monocytes Relative: 9 %
Neutro Abs: 4.6 10*3/uL (ref 1.7–7.7)
Neutrophils Relative %: 64 %
Platelets: 298 10*3/uL (ref 150–400)
RBC: 3.87 MIL/uL (ref 3.87–5.11)
RDW: 14.2 % (ref 11.5–15.5)
WBC: 7.2 10*3/uL (ref 4.0–10.5)
nRBC: 0 % (ref 0.0–0.2)

## 2021-08-08 LAB — COMPREHENSIVE METABOLIC PANEL
ALT: 17 U/L (ref 0–44)
AST: 23 U/L (ref 15–41)
Albumin: 3.4 g/dL — ABNORMAL LOW (ref 3.5–5.0)
Alkaline Phosphatase: 80 U/L (ref 38–126)
Anion gap: 11 (ref 5–15)
BUN: 59 mg/dL — ABNORMAL HIGH (ref 8–23)
CO2: 29 mmol/L (ref 22–32)
Calcium: 9.7 mg/dL (ref 8.9–10.3)
Chloride: 98 mmol/L (ref 98–111)
Creatinine, Ser: 1.27 mg/dL — ABNORMAL HIGH (ref 0.44–1.00)
GFR, Estimated: 39 mL/min — ABNORMAL LOW (ref >60)
Glucose, Bld: 112 mg/dL — ABNORMAL HIGH (ref 70–99)
Potassium: 4 mmol/L (ref 3.5–5.1)
Sodium: 138 mmol/L (ref 135–145)
Total Bilirubin: 0.5 mg/dL (ref 0.3–1.2)
Total Protein: 7.9 g/dL (ref 6.5–8.1)

## 2021-08-08 LAB — URINALYSIS, ROUTINE W REFLEX MICROSCOPIC
Bilirubin Urine: NEGATIVE
Glucose, UA: NEGATIVE mg/dL
Hgb urine dipstick: NEGATIVE
Ketones, ur: NEGATIVE mg/dL
Nitrite: NEGATIVE
Protein, ur: NEGATIVE mg/dL
Specific Gravity, Urine: 1.013 (ref 1.005–1.030)
pH: 7 (ref 5.0–8.0)

## 2021-08-08 LAB — TROPONIN I (HIGH SENSITIVITY): Troponin I (High Sensitivity): 14 ng/L (ref <18)

## 2021-08-08 LAB — CK: Total CK: 61 U/L (ref 38–234)

## 2021-08-08 MED ORDER — SODIUM CHLORIDE 0.9 % IV BOLUS
500.0000 mL | Freq: Once | INTRAVENOUS | Status: AC
  Administered 2021-08-08: 500 mL via INTRAVENOUS

## 2021-08-08 NOTE — ED Triage Notes (Signed)
Pt's bp meds were changed and she was only supposed to take a half but when she got her new pills she still had a whole- pt states she has not taken her BP meds today at all- pt states she "feels low" and has not had any appetite

## 2021-08-08 NOTE — ED Triage Notes (Signed)
Pt states that she just took all of her medications since she had been waiting. Does not feel that her BP is low anymore.

## 2021-08-08 NOTE — Discharge Instructions (Signed)
Your work-up was reassuring with we did give you some fluids for a little elevated heart rate and a little bit of dehydration.  Return to the ER if you develop worsening symptoms or any other concerns

## 2021-08-08 NOTE — ED Provider Notes (Signed)
[facility] Provider Note    Event Date/Time   First MD Initiated Contact with Patient 08/08/21 2054     (approximate)   History   Dizziness   HPI  Tammy Mann is a 88 y.o. female who comes in with dizziness.  Patient reports that her blood pressure meds were changed and she did not feel that they needed to be changed.  She reports some increasing weakness.  She reports that she feels at her normal self at this time.  Patient's daughter is at bedside who gotten a call from the [facility] doctor.  I did speak with Dr. Darlys Gales who reports that patient had 3 weeks of some mild weakness and decreased p.o. intake.  They do home visits and they noted that her blood pressure was significantly low in the 80s so they have been trying to wean down her blood pressure medicine.  They briefly had her on half doses of her blood pressure medicine but then took her off of them.  She was on atenolol 100 and amlodipine 5.  The patient is just upset that they were taken off and even with trying to explain this to her she does not quite get why they were taken off.  They have follow-up tomorrow with the pace team.  Patient does state that She denies any hitting of her head.  She denies any black tarry stool   Physical Exam   Triage Vital Signs: ED Triage Vitals  Enc Vitals Group     BP 08/08/21 1614 113/72     Pulse Rate 08/08/21 1614 (!) 106     Resp 08/08/21 1614 18     Temp 08/08/21 1614 97.9 F (36.6 C)     Temp Source 08/08/21 1614 Oral     SpO2 08/08/21 1614 93 %     Weight 08/08/21 1616 120 lb (54.4 kg)     Height 08/08/21 1616 4\' 10"  (1.473 m)     Head Circumference --      Peak Flow --      Pain Score 08/08/21 1615 0     Pain Loc --      Pain Edu? --      Excl. in [place]? --     Most recent vital signs: Vitals:   08/08/21 1614 08/08/21 1952  BP: 113/72 (!) 179/120  Pulse: (!) 106 (!) 106  Resp: 18 20  Temp: 97.9 F (36.6 C)   SpO2: 93% 96%      General: Awake, no distress.  CV:  Good peripheral perfusion.  Resp:  Normal effort.  Abd:  No distention.  Soft and nontender Other:  Patient has equal strength in arms and legs.  Sensations intact throughout.  Finger-nose intact.   ED Results / Procedures / Treatments   Labs (all labs ordered are listed, but only abnormal results are displayed) Labs Reviewed  URINALYSIS, ROUTINE W REFLEX MICROSCOPIC - Abnormal; Notable for the following components:      Result Value   Color, Urine YELLOW (*)    APPearance CLOUDY (*)    Leukocytes,Ua SMALL (*)    Bacteria, UA RARE (*)    All other components within normal limits  CBC WITH DIFFERENTIAL/PLATELET - Abnormal; Notable for the following components:   Hemoglobin 10.1 (*)    HCT 33.1 (*)    All other components within normal limits  COMPREHENSIVE METABOLIC PANEL - Abnormal; Notable for the following components:   Glucose, Bld 112 (*)  BUN 59 (*)    Creatinine, Ser 1.27 (*)    Albumin 3.4 (*)    GFR, Estimated 39 (*)    All other components within normal limits  TROPONIN I (HIGH SENSITIVITY)     EKG  My interpretation of EKG:  Tachycardia rate of 103, no ST elevation T wave inversion in lead III and V2 and V3 right bundle branch block    PROCEDURES:  Critical Care performed: No  .1-3 Lead EKG Interpretation Performed by: Vanessa [place], MD Authorized by: Vanessa [place], MD     Interpretation: normal     ECG rate:  80   ECG rate assessment: normal     Rhythm: sinus rhythm     Ectopy: none     Conduction: normal     MEDICATIONS ORDERED IN ED: Medications  sodium chloride 0.9 % bolus 500 mL (0 mLs Intravenous Stopped 08/08/21 2234)     IMPRESSION / MDM / ASSESSMENT AND PLAN / ED COURSE  I reviewed the triage vital signs and the nursing notes.  Patient comes in with some concerns of increasing weakness, decreased p.o. intake, weakness.  She is slightly tachycardic but normotensive on arrival.  There was a  blood pressure that was super hypertensive I do not feel that this was accurate given we rechecked it and she was normotensive.  I again encouraged patient and explained why her blood pressure meds were taken off.  Given her tachycardia with elevation in her creatinine given she was previously 1.04 I have given her some fluids.  Hemoglobin is slightly low but she denies any black tarry stools and the pace team is going to work this up outpatient.  She denies any fall and hitting her head.  Troponin negative Urine without evidence of UTI CBC shows slightly low hemoglobin of 10.1 but no priors CMP shows slightly elevated creatinine at 1.27  I reviewed patient's note from [facility] clinic for issues with her on a where she followed up with ophthalmology  Reevaluated patient asked if she was feeling dizzy.  Patient reports "I was never feeling dizzy".  Patient's heart rates come down with fluids and she reports feeling well.  Her pace doctor is going to follow-up with her tomorrow.  At this time will discharge patient home.  I discussed the provisional nature of ED diagnosis, the treatment so far, the ongoing plan of care, follow up appointments and return precautions with the patient and any family or support people present. They expressed understanding and agreed with the plan, discharged home.      The patient is on the cardiac monitor to evaluate for evidence of arrhythmia and/or significant heart rate changes.      FINAL CLINICAL IMPRESSION(S) / ED DIAGNOSES   Final diagnoses:  Dehydration     Rx / DC Orders   ED Discharge Orders     None        Note:  This document was prepared using Dragon voice recognition software and may include unintentional dictation errors.   Vanessa [place], MD 08/08/21 [phone]

## 2021-09-18 DEATH — deceased
